# Patient Record
Sex: Female | Born: 2000
Health system: Southern US, Community
[De-identification: ages and names within clinical notes are randomized; demographics above are authoritative.]

---
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
## 2020-12-16 IMAGING — DX DG CHEST 1V PORT
1 series · 1 of 1 positions shown · non-contrast
Comparison: None.

CLINICAL DATA: Altered mental status after vaping marijuana.

EXAM:
PORTABLE CHEST 1 VIEW

[chest ap]
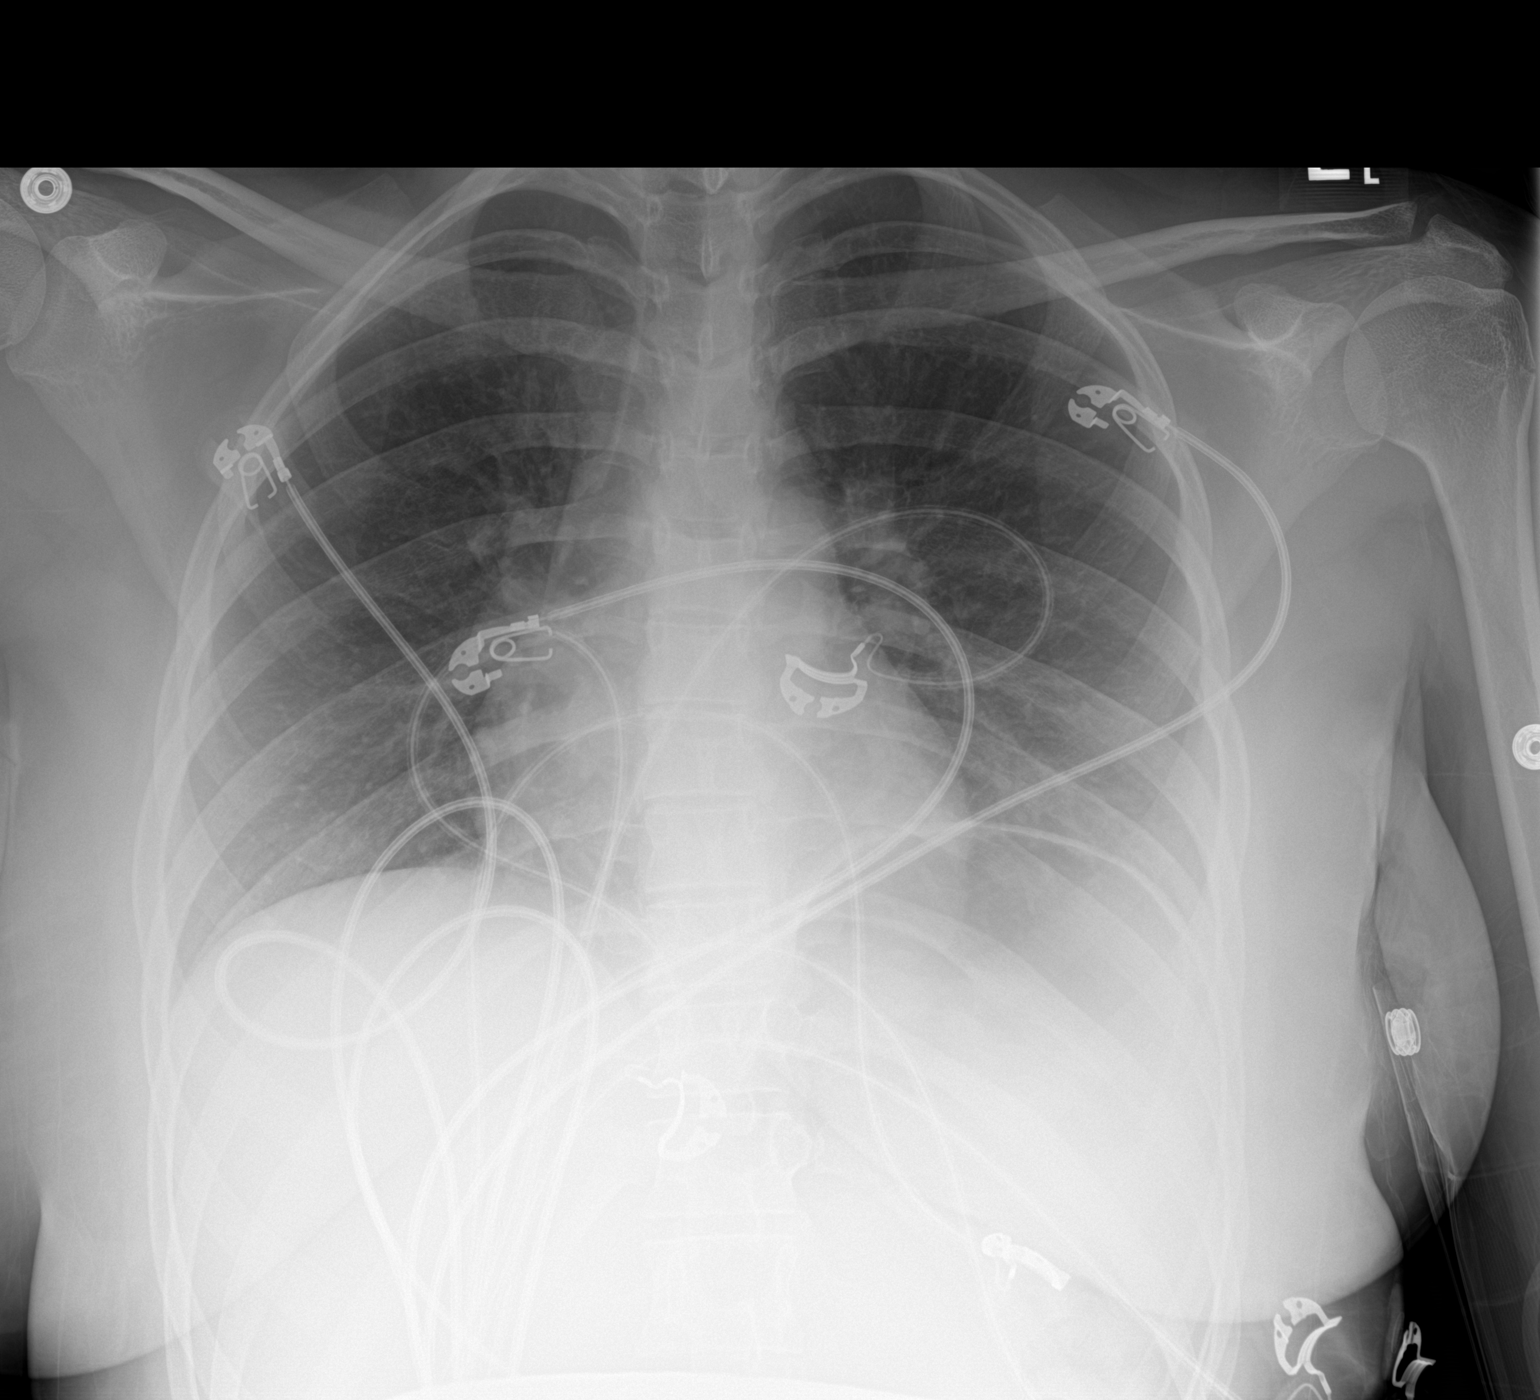

[1 of 1 positions shown; findings below may reference images not displayed]

FINDINGS: Small to moderate left pleural effusion and basilar airspace disease
are seen. The right lung is clear. No pneumothorax. Heart size is
normal.
IMPRESSION: Small to moderate left effusion and basilar airspace disease could
be secondary to vaping injury or pneumonia.

## 2023-03-27 DIAGNOSIS — Z01419 Encounter for gynecological examination (general) (routine) without abnormal findings: Secondary | ICD-10-CM | POA: Diagnosis not present

## 2023-03-27 DIAGNOSIS — Z113 Encounter for screening for infections with a predominantly sexual mode of transmission: Secondary | ICD-10-CM | POA: Diagnosis not present

## 2023-03-27 DIAGNOSIS — Z1159 Encounter for screening for other viral diseases: Secondary | ICD-10-CM | POA: Diagnosis not present

## 2023-03-27 DIAGNOSIS — Z1331 Encounter for screening for depression: Secondary | ICD-10-CM | POA: Diagnosis not present

## 2023-03-27 DIAGNOSIS — Z118 Encounter for screening for other infectious and parasitic diseases: Secondary | ICD-10-CM | POA: Diagnosis not present

## 2023-03-27 DIAGNOSIS — Z114 Encounter for screening for human immunodeficiency virus [HIV]: Secondary | ICD-10-CM | POA: Diagnosis not present

## 2023-03-27 DIAGNOSIS — Z124 Encounter for screening for malignant neoplasm of cervix: Secondary | ICD-10-CM | POA: Diagnosis not present

## 2023-04-16 ENCOUNTER — Encounter (HOSPITAL_BASED_OUTPATIENT_CLINIC_OR_DEPARTMENT_OTHER): Payer: Self-pay | Admitting: Family Medicine

## 2023-04-16 ENCOUNTER — Ambulatory Visit (HOSPITAL_BASED_OUTPATIENT_CLINIC_OR_DEPARTMENT_OTHER): Admitting: Family Medicine

## 2023-04-16 VITALS — BP 104/76 | HR 65 | Ht 63.0 in | Wt 111.9 lb

## 2023-04-16 DIAGNOSIS — G43909 Migraine, unspecified, not intractable, without status migrainosus: Secondary | ICD-10-CM | POA: Diagnosis not present

## 2023-04-16 DIAGNOSIS — Z0000 Encounter for general adult medical examination without abnormal findings: Secondary | ICD-10-CM

## 2023-04-16 DIAGNOSIS — J3489 Other specified disorders of nose and nasal sinuses: Secondary | ICD-10-CM | POA: Insufficient documentation

## 2023-04-16 MED ORDER — SUMATRIPTAN SUCCINATE 50 MG PO TABS: 50.0000 mg | ORAL_TABLET | ORAL | 0 refills | Status: AC | PRN

## 2023-04-16 NOTE — Patient Instructions (Signed)
  Medication Instructions:  Your physician recommends that you continue on your current medications as directed. Please refer to the Current Medication list given to you today. --If you need a refill on any your medications before your next appointment, please call your pharmacy first. If no refills are authorized on file call the office.-- Lab Work: Your physician has recommended that you have lab work today: 1 week prior to physical If you have labs (blood work) drawn today and your tests are completely normal, you will receive your results via MyChart message OR a phone call from our staff.  Please ensure you check your voicemail in the event that you authorized detailed messages to be left on a delegated number. If you have any lab test that is abnormal or we need to change your treatment, we will call you to review the results.  Referrals/Procedures/Imaging: no  Follow-Up: Your next appointment:   Your physician recommends that you schedule a follow-up appointment in: 6 weeks with Dr. de [place]  You will receive a text message or e-mail with a link to a survey about your care and experience with [place] today! We would greatly appreciate your feedback!   Thanks for letting us be apart of your health journey!!  Primary Care and Sports Medicine   Dr. Ceasar Mons [place]   We encourage you to activate your patient portal called "MyChart".  Sign up information is provided on this After Visit Summary.  MyChart is used to connect with patients for Virtual Visits (Telemedicine).  Patients are able to view lab/test results, encounter notes, upcoming appointments, etc.  Non-urgent messages can be sent to your provider as well. To learn more about what you can do with MyChart, please visit --  [email or web address].

## 2023-04-16 NOTE — Progress Notes (Signed)
 New Patient Office Visit  Subjective   Patient ID: Carol Garcia, female    DOB: [birth year]  Age: 23 y.o. MRN: [id number]  CC:  Chief Complaint  Patient presents with   New Patient (Initial Visit)    New Patient has not had pcp in awhile sometimes when sleeping has yellow drainage coming out of nose and feels like she is choking and she gets migraines this has been going on for a few years     HPI Carol Garcia presents to establish care Last PCP - none recently  Headaches: reports history of migraines. Was using Excedrin at one point, stopped taking because it wasn't working. Reports about 4 times per month, lasting a couple days. More so over right side of head, usually towards the front. Will have associated light and noise sensitivity. No visual changes.  Will occasionally have nasal drainage - yellow at times, notes only when laying down, does not happen all the time, no associated sneezing, watery or itchy eyes.  On further discussion, she indicates that her mom has told her that she did have issues with this in the past and has met with ENT specialist.  Indicates that procedural treatment was discussed, but patient ultimately declined  Patient is originally from [place]. Has lived here since 2017. 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 She enjoys relaxing.  Outpatient Encounter Medications as of 04/16/2023  Medication Sig   SUMAtriptan (IMITREX) 50 MG tablet Take 1 tablet (50 mg total) by mouth every 2 (two) hours as needed for migraine. May repeat in 2 hours if headache persists or recurs.   No facility-administered encounter medications on file as of 04/16/2023.    History reviewed. No pertinent past medical history.  History reviewed. No pertinent surgical history.  Family History  Problem Relation Age of Onset   Cancer Other     Social History   Socioeconomic History   Marital status: Single    Spouse name: Not on file   Number of children: Not on file    Years of education: Not on file   Highest education level: Not on file  Occupational History   Not on file  Tobacco Use   Smoking status: Never    Passive exposure: Never   Smokeless tobacco: Never  Vaping Use   Vaping status: Never Used  Substance and Sexual Activity   Alcohol use: No   Drug use: No   Sexual activity: Not on file  Other Topics Concern   Not on file  Social History Narrative   Not on file   Social Drivers of Health   Financial Resource Strain: Not on file  Food Insecurity: Not on file  Transportation Needs: Not on file  Physical Activity: Not on file  Stress: Not on file  Social Connections: Not on file  Intimate Partner Violence: Not on file    Objective   BP 104/76 (BP Location: Left Arm, Patient Position: Sitting, Cuff Size: Normal)   Pulse 65   Ht 5\' 3"  (1.6 m)   Wt 111 lb 14.4 oz (50.8 kg)   SpO2 100%   BMI 19.82 kg/m   Physical Exam  23 year old female in no acute distress Cardiovascular exam with regular rate and rhythm Lungs clear to auscultation bilaterally  Assessment & Plan:   Migraine without status migrainosus, not intractable, unspecified migraine type Assessment & Plan: Current headaches do sound like underlying migraines.  We discussed management considerations.  Can proceed with trial of triptan for abortive therapy.  Discussed recommendations related to when to take medication and expectations regarding management.  We can follow-up on progress at next office visit.  We also discussed maintaining headache diary.  Provided handout with considerations related to migraines and what to be mindful of from a lifestyle and dietary standpoint   Chronic nasal discharge Assessment & Plan: Discussed options.  She would like to meet with ENT specialist, referral placed today  Orders: -     Ambulatory referral to ENT  Wellness examination -     CBC with Differential/Platelet; Future -     Comprehensive metabolic panel with GFR;  Future -     Lipid panel; Future -     TSH Rfx on Abnormal to Free T4; Future  Other orders -     SUMAtriptan Succinate; Take 1 tablet (50 mg total) by mouth every 2 (two) hours as needed for migraine. May repeat in 2 hours if headache persists or recurs.  Dispense: 30 tablet; Refill: 0  Return in about 6 weeks (around 05/28/2023) for CPE with fasting labs 1 week prior.    ___________________________________________ Koreen Lizaola de Peru, MD, ABFM, CAQSM Primary Care and Sports Medicine [facility]

## 2023-04-16 NOTE — Assessment & Plan Note (Signed)
 Discussed options.  She would like to meet with ENT specialist, referral placed today

## 2023-04-16 NOTE — Assessment & Plan Note (Signed)
 Current headaches do sound like underlying migraines.  We discussed management considerations.  Can proceed with trial of triptan for abortive therapy.  Discussed recommendations related to when to take medication and expectations regarding management.  We can follow-up on progress at next office visit.  We also discussed maintaining headache diary.  Provided handout with considerations related to migraines and what to be mindful of from a lifestyle and dietary standpoint

## 2023-04-24 ENCOUNTER — Encounter (INDEPENDENT_AMBULATORY_CARE_PROVIDER_SITE_OTHER): Payer: Self-pay

## 2023-05-07 NOTE — Progress Notes (Unsigned)
 Cardiology Office Note:    Date:  05/08/2023   ID:  Carol Garcia 2000/02/06, MRN [id number]  PCP:  de Peru, Raymond J, MD  Cardiologist:  New Patient Click to update primary MD,subspecialty MD or APP then REFRESH:1}    Referring MD: de [place], Alonza Jansky, MD   Chief Complaint: palpitations   History of Present Illness:    Carol Garcia is a 23 y.o. female with a history of migraines but no prior cardiac history who presents today as a new patient for further evaluation of palpitations.   Patient states she was laying down watch a movie a couple of night ago when she all of a sudden felt like her heart was beating "hard and slow" in her chest. This lasted for a couple of seconds and then she had another episode of this shortly after. She sat up and palpitations resolved. She states she has had a "little fluttering" before but states this felt different. No prolonged episodes of palpitations. She denies any caffeine use. She describes some atypical chest pain about 2 months ago that lasted for a couple of days. She states it felt like a "bruise" and describes some pleuritic pain but states it was not reproducible with palpation. This pain went away after a couple of days. She denies any other chest pain. No exertional chest pain. No shortness of breath, orthopnea, PND, edema, lightheadedness, dizziness, or syncope.   She does have a family history of CAD with her maternal grandfather having had a heart attack in his 88s. However, no other known family history of heart disease. She denies any tobacco use. She reports very rare alcohol and marijuana use.   EKGs/Labs/Other Studies Reviewed:    The following studies were reviewed:  N/A  EKG:  EKG ordered today.  EKG Interpretation Date/Time:  Tuesday May 08 2023 08:33:24 EDT Ventricular Rate:  66 PR Interval:  134 QRS Duration:  70 QT Interval:  404 QTC Calculation: 423 R Axis:   56  Text Interpretation: Normal sinus  rhythm with sinus arrhythmia Normal ECG When compared with ECG of 27-Feb-2018 12:25, No significant change was found Confirmed by Sharren Decree [phone]) on 05/08/2023 8:40:11 AM     Recent Labs: No results found for requested labs within last 365 days.  Recent Lipid Panel No results found for: "CHOL", "TRIG", "HDL", "CHOLHDL", "VLDL", "LDLCALC", "LDLDIRECT"  Physical Exam:    Vital Signs: BP 96/70   Pulse 66   Ht 5\' 3"  (1.6 m)   Wt 108 lb (49 kg)   SpO2 98%   BMI 19.13 kg/m     Wt Readings from Last 3 Encounters:  05/08/23 108 lb (49 kg)  04/16/23 111 lb 14.4 oz (50.8 kg)  03/20/18 140 lb 12.8 oz (63.9 kg) (72%, Z= 0.59)*   * Growth percentiles are based on CDC (Girls, 2-20 Years) data.     General: 23 y.o. thin female in no acute distress. HEENT: Normocephalic and atraumatic. Sclera clear.  Neck: Supple. No carotid bruits. No JVD. Heart: RRR. Distinct S1 and S2. No murmurs, gallops, or rubs.  Lungs: No increased work of breathing. Clear to ausculation bilaterally. No wheezes, rhonchi, or rales.  Extremities: No lower extremity edema.   Skin: Warm and dry. Neuro: No focal deficits. Psych: Normal affect. Responds appropriately.   Assessment:    1. Palpitations   2. History of atypical chest pain   3. Screening for lipid disorders     Plan:    Palpitations  Patient reports an brief episode of palpitations that she describes as her heart "beating hard and slow" in her chest the other night. She also reports a "little fluttering" in the past but states this episode felt different. No prolonged/ sustained palpitations. No lightheadedness, dizziness, or syncope. - EKG today shows normal sinus rhythm with mild sinus arrhythmia.  - Symptoms sound like PACs/ PVCs. Explained that these are benign.  - If symptoms become more frequent, can order a Zio monitor. However, will hold off on this for now given palpitations seem to be rare. Also discussed [facility] device.  -  Will check CBC, CMET, Mag, and TSH.   History of Atypical Chest Pain Patient describes some atypical chest pain 2 months ago that lasted for a couple of days. No recurrence since then. No exertional chest pain. - EKG today shows no acute ischemic changes. - Sounds non-cardiac. Possible musculoskeletal. No additional ischemic work-up necessary at this time.   Screening Lipid Panel - Will go ahead and check fasting lipid panel today since we are getting other labs as well.   Disposition: Follow up as needed.   Signed, Casimer Clear, PA-C  05/08/2023 9:13 AM    [facility]

## 2023-05-08 ENCOUNTER — Encounter: Payer: Self-pay | Admitting: Student

## 2023-05-08 ENCOUNTER — Ambulatory Visit: Attending: Student | Admitting: Student

## 2023-05-08 VITALS — BP 96/70 | HR 66 | Ht 63.0 in | Wt 108.0 lb

## 2023-05-08 DIAGNOSIS — R002 Palpitations: Secondary | ICD-10-CM

## 2023-05-08 DIAGNOSIS — R0789 Other chest pain: Secondary | ICD-10-CM

## 2023-05-08 DIAGNOSIS — Z13220 Encounter for screening for lipoid disorders: Secondary | ICD-10-CM

## 2023-05-08 LAB — CBC

## 2023-05-08 LAB — MAGNESIUM: Magnesium: 2 mg/dL (ref 1.6–2.3)

## 2023-05-08 NOTE — Patient Instructions (Signed)
 Medication Instructions:  NO CHANGES  *If you need a refill on your cardiac medications before your next appointment, please call your pharmacy*  Lab Work: Lipid Panel  TSH CBC CMET Magnesium   If you have labs (blood work) drawn today and your tests are completely normal, you will receive your results only by: MyChart Message (if you have MyChart) OR A paper copy in the mail If you have any lab test that is abnormal or we need to change your treatment, we will call you to review the results.   Follow-Up: At [facility], you and your health needs are our priority.  As part of our continuing mission to provide you with exceptional heart care, our providers are all part of one team.  This team includes your primary Cardiologist (physician) and Advanced Practice Providers or APPs (Physician Assistants and Nurse Practitioners) who all work together to provide you with the care you need, when you need it.  Your next appointment:   AS NEEDED      1st Floor: - Lobby - Registration  - Pharmacy  - Lab - Cafe  2nd Floor: - PV Lab - Diagnostic Testing (echo, CT, nuclear med)  3rd Floor: - Vacant  4th Floor: - TCTS (cardiothoracic surgery) - AFib Clinic - Structural Heart Clinic - Vascular Surgery  - Vascular Ultrasound  5th Floor: - HeartCare Cardiology (general and EP) - Clinical Pharmacy for coumadin, hypertension, lipid, weight-loss medications, and med management appointments    Valet parking services will be available as well.

## 2023-05-09 LAB — COMPREHENSIVE METABOLIC PANEL WITH GFR
ALT: 11 IU/L (ref 0–32)
AST: 17 IU/L (ref 0–40)
Albumin: 4.5 g/dL (ref 4.0–5.0)
Alkaline Phosphatase: 53 IU/L (ref 44–121)
BUN/Creatinine Ratio: 20 (ref 9–23)
BUN: 16 mg/dL (ref 6–20)
Bilirubin Total: 0.4 mg/dL (ref 0.0–1.2)
CO2: 21 mmol/L (ref 20–29)
Calcium: 9.4 mg/dL (ref 8.7–10.2)
Chloride: 107 mmol/L — ABNORMAL HIGH (ref 96–106)
Creatinine, Ser: 0.8 mg/dL (ref 0.57–1.00)
Globulin, Total: 2.8 g/dL (ref 1.5–4.5)
Potassium: 4.5 mmol/L (ref 3.5–5.2)
Potassium: 84 mmol/L (ref 70–99)
Sodium: 140 mmol/L (ref 134–144)
Total Protein: 7.3 g/dL (ref 6.0–8.5)
eGFR: 105 mL/min/{1.73_m2} (ref >59)

## 2023-05-09 LAB — CBC
Hematocrit: 41.1 % (ref 34.0–46.6)
Hemoglobin: 13.1 g/dL (ref 11.1–15.9)
MCH: 26.5 pg — ABNORMAL LOW (ref 26.6–33.0)
MCHC: 31.9 g/dL (ref 31.5–35.7)
MCV: 83 fL (ref 79–97)
Platelets: 275 10*3/uL (ref 150–450)
RBC: 4.94 x10E6/uL (ref 3.77–5.28)
RDW: 13 % (ref 11.7–15.4)
WBC: 6.8 10*3/uL (ref 3.4–10.8)

## 2023-05-09 LAB — LIPID PANEL
Chol/HDL Ratio: 2.3 ratio (ref 0.0–4.4)
Cholesterol, Total: 147 mg/dL (ref 100–199)
HDL: 63 mg/dL (ref >39)
LDL Chol Calc (NIH): 75 mg/dL (ref 0–99)
Triglycerides: 35 mg/dL (ref 0–149)
VLDL Cholesterol Cal: 9 mg/dL (ref 5–40)

## 2023-05-09 LAB — TSH: TSH: 0.707 u[IU]/mL (ref 0.450–4.500)

## 2023-06-01 ENCOUNTER — Other Ambulatory Visit (HOSPITAL_COMMUNITY): Payer: Self-pay | Admitting: Family Medicine

## 2023-06-01 ENCOUNTER — Other Ambulatory Visit (HOSPITAL_BASED_OUTPATIENT_CLINIC_OR_DEPARTMENT_OTHER): Payer: Self-pay | Admitting: *Deleted

## 2023-06-01 DIAGNOSIS — Z0000 Encounter for general adult medical examination without abnormal findings: Secondary | ICD-10-CM

## 2023-06-02 LAB — COMPREHENSIVE METABOLIC PANEL WITH GFR
ALT: 12 IU/L (ref 0–32)
AST: 14 IU/L (ref 0–40)
Albumin: 4.4 g/dL (ref 4.0–5.0)
Alkaline Phosphatase: 53 IU/L (ref 44–121)
BUN/Creatinine Ratio: 17 (ref 9–23)
BUN: 14 mg/dL (ref 6–20)
Bilirubin Total: 0.5 mg/dL (ref 0.0–1.2)
CO2: 21 mmol/L (ref 20–29)
Calcium: 9.4 mg/dL (ref 8.7–10.2)
Chloride: 107 mmol/L — ABNORMAL HIGH (ref 96–106)
Creatinine, Ser: 0.81 mg/dL (ref 0.57–1.00)
Globulin, Total: 2.3 g/dL (ref 1.5–4.5)
Glucose: 87 mg/dL (ref 70–99)
Potassium: 3.9 mmol/L (ref 3.5–5.2)
Sodium: 140 mmol/L (ref 134–144)
Total Protein: 6.7 g/dL (ref 6.0–8.5)
eGFR: 104 mL/min/{1.73_m2} (ref >59)

## 2023-06-02 LAB — LIPID PANEL
Chol/HDL Ratio: 2.2 ratio (ref 0.0–4.4)
Cholesterol, Total: 139 mg/dL (ref 100–199)
HDL: 64 mg/dL (ref >39)
LDL Chol Calc (NIH): 65 mg/dL (ref 0–99)
Triglycerides: 40 mg/dL (ref 0–149)
VLDL Cholesterol Cal: 10 mg/dL (ref 5–40)

## 2023-06-02 LAB — CBC WITH DIFFERENTIAL/PLATELET
Basophils Absolute: 0 10*3/uL (ref 0.0–0.2)
Basos: 1 %
EOS (ABSOLUTE): 0.2 10*3/uL (ref 0.0–0.4)
Eos: 2 %
Hematocrit: 39.5 % (ref 34.0–46.6)
Hemoglobin: 12.4 g/dL (ref 11.1–15.9)
Immature Grans (Abs): 0 10*3/uL (ref 0.0–0.1)
Immature Granulocytes: 0 %
Lymphocytes Absolute: 2.1 10*3/uL (ref 0.7–3.1)
Lymphs: 28 %
MCH: 26.7 pg (ref 26.6–33.0)
MCHC: 31.4 g/dL — ABNORMAL LOW (ref 31.5–35.7)
MCV: 85 fL (ref 79–97)
Monocytes Absolute: 0.8 10*3/uL (ref 0.1–0.9)
Monocytes: 10 %
Neutrophils Absolute: 4.4 10*3/uL (ref 1.4–7.0)
Neutrophils: 59 %
Platelets: 250 10*3/uL (ref 150–450)
RBC: 4.64 x10E6/uL (ref 3.77–5.28)
RDW: 13.5 % (ref 11.7–15.4)
WBC: 7.5 10*3/uL (ref 3.4–10.8)

## 2023-06-02 LAB — TSH RFX ON ABNORMAL TO FREE T4: TSH: 1.45 u[IU]/mL (ref 0.450–4.500)

## 2023-06-07 ENCOUNTER — Other Ambulatory Visit (HOSPITAL_COMMUNITY)
Admission: RE | Admit: 2023-06-07 | Discharge: 2023-06-07 | Disposition: A | Discharge status: Home / Self Care | Source: Ambulatory Visit | Attending: Family Medicine | Admitting: Family Medicine

## 2023-06-07 ENCOUNTER — Ambulatory Visit (INDEPENDENT_AMBULATORY_CARE_PROVIDER_SITE_OTHER): Payer: PRIVATE HEALTH INSURANCE | Admitting: Family Medicine

## 2023-06-07 VITALS — BP 114/73 | HR 73 | Ht 63.0 in | Wt 112.2 lb

## 2023-06-07 DIAGNOSIS — Z113 Encounter for screening for infections with a predominantly sexual mode of transmission: Secondary | ICD-10-CM | POA: Diagnosis not present

## 2023-06-07 DIAGNOSIS — Z0000 Encounter for general adult medical examination without abnormal findings: Secondary | ICD-10-CM

## 2023-06-07 NOTE — Patient Instructions (Signed)
  Medication Instructions:  Your physician recommends that you continue on your current medications as directed. Please refer to the Current Medication list given to you today. --If you need a refill on any your medications before your next appointment, please call your pharmacy first. If no refills are authorized on file call the office.-- Lab Work: Your physician has recommended that you have lab work today: yes If you have labs (blood work) drawn today and your tests are completely normal, you will receive your results via MyChart message OR a phone call from our staff.  Please ensure you check your voicemail in the event that you authorized detailed messages to be left on a delegated number. If you have any lab test that is abnormal or we need to change your treatment, we will call you to review the results.  Referrals/Procedures/Imaging: no  Follow-Up: Your next appointment:   Your physician recommends that you schedule a follow-up appointment in: 1 year  with Dr. de Peru  You will receive a text message or e-mail with a link to a survey about your care and experience with [place] today! We would greatly appreciate your feedback!   Thanks for letting us be apart of your health journey!!  Primary Care and Sports Medicine   Dr. Ceasar Mons Peru   We encourage you to activate your patient portal called "MyChart".  Sign up information is provided on this After Visit Summary.  MyChart is used to connect with patients for Virtual Visits (Telemedicine).  Patients are able to view lab/test results, encounter notes, upcoming appointments, etc.  Non-urgent messages can be sent to your provider as well. To learn more about what you can do with MyChart, please visit --  [email or web address].

## 2023-06-07 NOTE — Progress Notes (Signed)
 Subjective:    CC: Annual Physical Exam  HPI: Carol Garcia is a 23 y.o. presenting for annual physical  I reviewed the past medical history, family history, social history, surgical history, and allergies today and no changes were needed.  Please see the problem list section below in epic for further details.  Past Medical History: No past medical history on file. Past Surgical History: No past surgical history on file. Social History: Social History   Socioeconomic History   Marital status: Single    Spouse name: Not on file   Number of children: Not on file   Years of education: Not on file   Highest education level: Not on file  Occupational History   Not on file  Tobacco Use   Smoking status: Never    Passive exposure: Never   Smokeless tobacco: Never  Vaping Use   Vaping status: Never Used  Substance and Sexual Activity   Alcohol use: No   Drug use: No   Sexual activity: Not on file  Other Topics Concern   Not on file  Social History Narrative   Not on file   Social Drivers of Health   Financial Resource Strain: Not on file  Food Insecurity: Not on file  Transportation Needs: Not on file  Physical Activity: Not on file  Stress: Not on file  Social Connections: Not on file   Family History: Family History  Problem Relation Age of Onset   Cancer Other    Allergies: No Known Allergies Medications: See med rec.  Review of Systems: No headache, visual changes, nausea, vomiting, diarrhea, constipation, dizziness, abdominal pain, skin rash, fevers, chills, night sweats, swollen lymph nodes, weight loss, chest pain, body aches, joint swelling, muscle aches, shortness of breath, mood changes, visual or auditory hallucinations.  Objective:    BP 114/73   Pulse 73   Ht 5\' 3"  (1.6 m)   Wt 112 lb 3.2 oz (50.9 kg)   SpO2 100%   BMI 19.88 kg/m   General: Well Developed, well nourished, and in no acute distress.  Neuro: Alert and oriented x3,  extra-ocular muscles intact, sensation grossly intact. Cranial nerves II through XII are intact, motor, sensory, and coordinative functions are all intact. HEENT: Normocephalic, atraumatic, pupils equal round reactive to light, neck supple, no masses, no lymphadenopathy, thyroid nonpalpable. Oropharynx, nasopharynx, external ear canals are unremarkable. Skin: Warm and dry, no rashes noted.  Cardiac: Regular rate and rhythm, no murmurs rubs or gallops.  Respiratory: Clear to auscultation bilaterally. Not using accessory muscles, speaking in full sentences.  Abdominal: Soft, nontender, nondistended, positive bowel sounds, no masses, no organomegaly.  Musculoskeletal: Shoulder, elbow, wrist, hip, knee, ankle stable, and with full range of motion.  Impression and Recommendations:    Wellness examination Assessment & Plan: Routine HCM labs reviewed. HCM reviewed/discussed. Anticipatory guidance regarding healthy weight, lifestyle and choices given. Recommend healthy diet.  Recommend approximately 150 minutes/week of moderate intensity exercise Recommend regular dental and vision exams Always use seatbelt/lap and shoulder restraints Recommend using smoke alarms and checking batteries at least twice a year Recommend using sunscreen when outside Discussed immunization recommendations   Routine screening for STI (sexually transmitted infection) Assessment & Plan: Patient would like to have STI screening completed today.  She denies any current symptoms; she is also not aware of any specific exposures. Can proceed with screening today  Orders: -     HIV Antibody (routine testing w rflx) -     RPR -  Urine cytology ancillary only  Return in about 1 year (around 06/06/2024) for CPE.   ___________________________________________ Carol Desaulniers de Peru, MD, ABFM, CAQSM Primary Care and Sports Medicine [facility]

## 2023-06-07 NOTE — Assessment & Plan Note (Signed)
 Patient would like to have STI screening completed today.  She denies any current symptoms; she is also not aware of any specific exposures. Can proceed with screening today

## 2023-06-07 NOTE — Assessment & Plan Note (Signed)

## 2023-06-08 ENCOUNTER — Telehealth (INDEPENDENT_AMBULATORY_CARE_PROVIDER_SITE_OTHER): Payer: Self-pay | Admitting: Otolaryngology

## 2023-06-08 LAB — URINE CYTOLOGY ANCILLARY ONLY
Chlamydia: NEGATIVE
Comment: NEGATIVE
Comment: NORMAL
Neisseria Gonorrhea: NEGATIVE

## 2023-06-08 LAB — RPR: RPR Ser Ql: NONREACTIVE

## 2023-06-08 LAB — HIV ANTIBODY (ROUTINE TESTING W REFLEX): HIV Screen 4th Generation wRfx: NONREACTIVE

## 2023-06-08 NOTE — Telephone Encounter (Signed)
 Confirmed appt & location [phone]

## 2023-06-12 ENCOUNTER — Ambulatory Visit (INDEPENDENT_AMBULATORY_CARE_PROVIDER_SITE_OTHER): Admitting: Otolaryngology

## 2023-06-12 ENCOUNTER — Encounter (INDEPENDENT_AMBULATORY_CARE_PROVIDER_SITE_OTHER): Payer: Self-pay | Admitting: Otolaryngology

## 2023-06-12 VITALS — BP 116/77 | HR 66 | Ht 63.0 in | Wt 112.0 lb

## 2023-06-12 DIAGNOSIS — J342 Deviated nasal septum: Secondary | ICD-10-CM | POA: Diagnosis not present

## 2023-06-12 DIAGNOSIS — J3489 Other specified disorders of nose and nasal sinuses: Secondary | ICD-10-CM | POA: Diagnosis not present

## 2023-06-12 DIAGNOSIS — N760 Acute vaginitis: Secondary | ICD-10-CM | POA: Diagnosis not present

## 2023-06-12 DIAGNOSIS — J329 Chronic sinusitis, unspecified: Secondary | ICD-10-CM

## 2023-06-12 DIAGNOSIS — J343 Hypertrophy of nasal turbinates: Secondary | ICD-10-CM | POA: Diagnosis not present

## 2023-06-12 DIAGNOSIS — R0982 Postnasal drip: Secondary | ICD-10-CM

## 2023-06-12 DIAGNOSIS — R0981 Nasal congestion: Secondary | ICD-10-CM

## 2023-06-12 DIAGNOSIS — J3089 Other allergic rhinitis: Secondary | ICD-10-CM

## 2023-06-12 MED ORDER — LEVOCETIRIZINE DIHYDROCHLORIDE 5 MG PO TABS: 5.0000 mg | ORAL_TABLET | Freq: Every evening | ORAL | 3 refills | Status: AC

## 2023-06-12 MED ORDER — FLUTICASONE PROPIONATE 50 MCG/ACT NA SUSP: 2.0000 | Freq: Every day | NASAL | 6 refills | Status: AC

## 2023-06-12 NOTE — Patient Instructions (Signed)
 Lloyd Huger Med Nasal Saline Rinse   - start nasal saline rinses with NeilMed Bottle available over the counter or online to help with nasal congestion

## 2023-06-12 NOTE — Progress Notes (Signed)
 ENT CONSULT:  Reason for Consult: chronic nasal congestion and post-nasal drainage    HPI: Discussed the use of AI scribe software for clinical note transcription with the patient, who gave verbal consent to proceed.  History of Present Illness Carol Garcia is a 23 year old female who presents with chronic nasal congestion and post-nasal drainage, worse at night.  She has a long-standing history of nasal congestion and drainage, described as watery and sometimes yellow. Her mother has noted the persistence of these symptoms, although she has only recently become aware of her chronic nature. No recent sinus infections, but a history of frequent sinus infections during childhood.  She experiences a sensation in her tooth when tilting her head back, which her dentist attributed to low sinuses based on dental scans. She recalls being taken to an ear, nose, and throat specialist in [place] during her childhood, where surgery was suggested, but she did not pursue it at the time.  She has episodes of feeling like she is choking on drainage while sleeping, describing it as 'like somebody's pouring water', which causes her to wake up and the drainage to pour out. Her sense of smell is ok. No headaches.   She has not been tested for allergies recently and is not currently on any medications for nasal congestion or drainage.    History reviewed. No pertinent past medical history.  History reviewed. No pertinent surgical history.  Family History  Problem Relation Age of Onset   Cancer Other     Social History:  reports that she has never smoked. She has never been exposed to tobacco smoke. She has never used smokeless tobacco. She reports that she does not drink alcohol and does not use drugs.  Allergies: No Known Allergies  Medications: I have reviewed the patient's current medications.  The PMH, PSH, Medications, Allergies, and SH were reviewed and updated.  ROS: Constitutional:  Negative for fever, weight loss and weight gain. Cardiovascular: Negative for chest pain and dyspnea on exertion. Respiratory: Is not experiencing shortness of breath at rest. Gastrointestinal: Negative for nausea and vomiting. Neurological: Negative for headaches. Psychiatric: The patient is not nervous/anxious  Blood pressure 116/77, pulse 66, height 5\' 3"  (1.6 m), weight 112 lb (50.8 kg), SpO2 99%. Body mass index is 19.84 kg/m.  PHYSICAL EXAM:  Exam: General: Well-developed, well-nourished Communication and Voice: Clear pitch and clarity Respiratory Respiratory effort: Equal inspiration and expiration without stridor Cardiovascular Peripheral Vascular: Warm extremities with equal color/perfusion Eyes: No nystagmus with equal extraocular motion bilaterally Neuro/Psych/Balance: Patient oriented to person, place, and time; Appropriate mood and affect; Gait is intact with no imbalance; Cranial nerves I-XII are intact Head and Face Inspection: Normocephalic and atraumatic without mass or lesion Palpation: Facial skeleton intact without bony stepoffs Salivary Glands: No mass or tenderness Facial Strength: Facial motility symmetric and full bilaterally ENT Pinna: External ear intact and fully developed External canal: Canal is patent with intact skin Tympanic Membrane: Clear and mobile External Nose: No scar or anatomic deformity Internal Nose: Septum is S-shaped and deviated with L > R narrowing. No polyp, or purulence. Mucosal edema and erythema present.  Bilateral inferior turbinate hypertrophy.  Lips, Teeth, and gums: Mucosa and teeth intact and viable TMJ: No pain to palpation with full mobility Oral cavity/oropharynx: No erythema or exudate, no lesions present Nasopharynx: No mass or lesion with intact mucosa Hypopharynx: Intact mucosa without pooling of secretions Neck Neck and Trachea: Midline trachea without mass or lesion Thyroid: No mass or nodularity  Lymphatics: No  lymphadenopathy  Procedure:   PROCEDURE NOTE: nasal endoscopy  Preoperative diagnosis: chronic nasal congestion and concern for sinusitis   Postoperative diagnosis: same  Procedure: Diagnostic nasal endoscopy (78295)  Surgeon: Artice Last, M.D.  Anesthesia: Topical lidocaine and Afrin  H&P REVIEW: The patient's history and physical were reviewed today prior to procedure. All medications were reviewed and updated as well. Complications: None Condition is stable throughout exam Indications and consent: The patient presents with symptoms of chronic sinusitis not responding to previous therapies. All the risks, benefits, and potential complications were reviewed with the patient preoperatively and informed consent was obtained. The time out was completed with confirmation of the correct procedure.   Procedure: The patient was seated upright in the clinic. Topical lidocaine and Afrin were applied to the nasal cavity. After adequate anesthesia had occurred, the rigid nasal endoscope was passed into the nasal cavity. The nasal mucosa, turbinates, septum, and sinus drainage pathways were visualized bilaterally. This revealed no purulence or significant secretions that might be cultured. There were no polyps or sites of significant inflammation. The mucosa was intact and there was no crusting present. The scope was then slowly withdrawn and the patient tolerated the procedure well. There were no complications or blood loss.      Studies Reviewed: 02/25/2018 FINDINGS: Small to moderate left pleural effusion and basilar airspace disease are seen. The right lung is clear. No pneumothorax. Heart size is normal.   IMPRESSION: Small to moderate left effusion and basilar airspace disease could be secondary to vaping injury or pneumonia.  Assessment/Plan: No diagnosis found.  Assessment and Plan Assessment & Plan Chronic nasal congestion and nasal obstruction and suspected environmental  allergies  Deviated nasal septum with ITH on nasal endoscopy today, small polypoid change in the left middle meatus, but no other polyps or pus.  - Initiated nightly antihistamine, Xyzal 5 mg daily  - Administered nasal corticosteroid spray (Flonase) once or twice daily. - Advised nasal irrigation with saline using a neti pot or Neilmed bottle.  Chronic vs Recurrent sinusitis concerns (suspected)  Differential includes sinus-related versus nasal obstruction and allergic inflammation. Allergy testing and sinus scan necessary for evaluation. - Order sinus CT sinus scan to evaluate for chronic sinusitis. - Recommend allergy testing to assess contributing factors.  RTC after CT sinuses and allergy testing (4 months)   Thank you for allowing me to participate in the care of this patient. Please do not hesitate to contact me with any questions or concerns.   Artice Last, MD Otolaryngology [facility] Phone: [phone] Fax: [phone]    06/12/2023, 8:40 AM

## 2023-06-17 ENCOUNTER — Telehealth: Payer: Self-pay | Admitting: Emergency Medicine

## 2023-06-17 ENCOUNTER — Encounter: Payer: Self-pay | Admitting: Emergency Medicine

## 2023-06-17 ENCOUNTER — Ambulatory Visit
Admission: EM | Admit: 2023-06-17 | Discharge: 2023-06-17 | Disposition: A | Discharge status: Home / Self Care | Attending: Family Medicine | Admitting: Family Medicine

## 2023-06-17 DIAGNOSIS — N898 Other specified noninflammatory disorders of vagina: Secondary | ICD-10-CM

## 2023-06-17 DIAGNOSIS — Z113 Encounter for screening for infections with a predominantly sexual mode of transmission: Secondary | ICD-10-CM | POA: Diagnosis not present

## 2023-06-17 DIAGNOSIS — R82998 Other abnormal findings in urine: Secondary | ICD-10-CM | POA: Diagnosis not present

## 2023-06-17 LAB — POCT URINALYSIS DIP (MANUAL ENTRY)
Bilirubin, UA: NEGATIVE
Blood, UA: NEGATIVE
Glucose, UA: NEGATIVE mg/dL
Ketones, POC UA: NEGATIVE mg/dL
Nitrite, UA: NEGATIVE
Protein Ur, POC: NEGATIVE mg/dL
Spec Grav, UA: 1.02 (ref 1.010–1.025)
Urobilinogen, UA: 0.2 U/dL
pH, UA: 6 (ref 5.0–8.0)

## 2023-06-17 LAB — POCT URINE PREGNANCY: Preg Test, Ur: NEGATIVE

## 2023-06-17 MED ORDER — CLOTRIMAZOLE 1 % VA CREA: 1.0000 | TOPICAL_CREAM | Freq: Every day | VAGINAL | 0 refills | Status: AC

## 2023-06-17 MED ORDER — FLUCONAZOLE 150 MG PO TABS
150.0000 mg | ORAL_TABLET | Freq: Every day | ORAL | 0 refills | Status: AC
Start: 2023-06-17 — End: ?

## 2023-06-17 MED ORDER — CLOTRIMAZOLE 1 % VA CREA: 1.0000 | TOPICAL_CREAM | Freq: Every day | VAGINAL | 0 refills | Status: DC

## 2023-06-17 MED ORDER — FLUCONAZOLE 150 MG PO TABS: 150.0000 mg | ORAL_TABLET | Freq: Every day | ORAL | 0 refills | Status: DC

## 2023-06-17 NOTE — ED Triage Notes (Signed)
 Patient states that she's been having vaginal irritation on the outside x 2 weeks.  Patient has seen her GYN given Metronidazole 500mg  and Tinidazole 500mg  w/o relief.  Still having vaginal discharge.  Pt was diagnosed with BV.  All of her STI testing was negative.

## 2023-06-17 NOTE — Discharge Instructions (Signed)
 The clinic will contact you with results of the urine culture as well as STD testing/vaginal swab done today.  Continue the metronidazole that you have been previously prescribed.  Stop the denies all and start Diflucan as well as topical clotrimazole vaginal cream externally to the area as needed.  Please follow-up with your gynecologist if your symptoms do not improve.  Please go to the ER for any worsening symptoms.  Hope you feel better soon!

## 2023-06-17 NOTE — ED Provider Notes (Signed)
 [facility]    CSN: [id number] Arrival date & time: 06/17/23  9811      History   Chief Complaint Chief Complaint  Patient presents with   Vaginitis    HPI Carol Garcia is a 23 y.o. female presents for vaginal irritation.  Patient reports 2 weeks of a external vaginal irritation/redness/swelling as well as a malodorous vaginal discharge.  She denies any vaginal rashes, shaving the area, dysuria, fevers, STD exposure or concern.  She did see her gynecologist last week and had a urine GC chlamydia that was negative as well as a negative HIV and RPR.  States a point-of-care testing was done for BV that was positive and she was started on metronidazole as well as tonight is all for potential yeast as well.  Patient's been taking both for about 3 to 4 days and states the odor has resolved from the discharge but she continues to have a lot of irritation externally.  No OTC treatments have been used since onset.  No other concerns at this time.  HPI  History reviewed. No pertinent past medical history.  Patient Active Problem List   Diagnosis Date Noted   Wellness examination 06/07/2023   Routine screening for STI (sexually transmitted infection) 06/07/2023   Chronic nasal discharge 04/16/2023   Migraine 04/16/2023    History reviewed. No pertinent surgical history.  OB History   No obstetric history on file.      Home Medications    Prior to Admission medications   Medication Sig Start Date End Date Taking? Authorizing Provider  clotrimazole (GYNE-LOTRIMIN) 1 % vaginal cream Place 1 Applicatorful vaginally at bedtime for 5 days. Apply externally to the vaginal area nightly 06/17/23 06/22/23 Yes Tavionna Grout, Jodi R, NP  fluconazole (DIFLUCAN) 150 MG tablet Take 1 tablet (150 mg total) by mouth daily. 06/17/23  Yes Kaimani Clayson, Jodi R, NP  fluticasone  (FLONASE ) 50 MCG/ACT nasal spray Place 2 sprays into both nostrils daily. 06/12/23  Yes Soldatova, Liuba, MD  levocetirizine (XYZAL   ALLERGY 24HR) 5 MG tablet Take 1 tablet (5 mg total) by mouth every evening. 06/12/23  Yes Soldatova, Liuba, MD  SUMAtriptan  (IMITREX ) 50 MG tablet Take 1 tablet (50 mg total) by mouth every 2 (two) hours as needed for migraine. May repeat in 2 hours if headache persists or recurs. Patient not taking: Reported on 06/12/2023 04/16/23   de [place], Alonza Jansky, MD    Family History Family History  Problem Relation Age of Onset   Cancer Other     Social History Social History   Tobacco Use   Smoking status: Never    Passive exposure: Never   Smokeless tobacco: Never  Vaping Use   Vaping status: Never Used  Substance Use Topics   Alcohol use: No   Drug use: No     Allergies   Patient has no known allergies.   Review of Systems Review of Systems  Genitourinary:        Vaginal irritation      Physical Exam Triage Vital Signs ED Triage Vitals  Encounter Vitals Group     BP 06/17/23 0933 107/77     Systolic BP Percentile --      Diastolic BP Percentile --      Pulse Rate 06/17/23 0933 69     Resp 06/17/23 0933 18     Temp 06/17/23 0933 98.7 F (37.1 C)     Temp Source 06/17/23 0933 Oral     SpO2 06/17/23 0933 98 %  Weight 06/17/23 0935 112 lb (50.8 kg)     Height 06/17/23 0935 5\' 3"  (1.6 m)     Head Circumference --      Peak Flow --      Pain Score 06/17/23 0935 4     Pain Loc --      Pain Education --      Exclude from Growth Chart --    No data found.  Updated Vital Signs BP 107/77 (BP Location: Left Arm)   Pulse 69   Temp 98.7 F (37.1 C) (Oral)   Resp 18   Ht 5\' 3"  (1.6 m)   Wt 112 lb (50.8 kg)   LMP 06/06/2023 (Exact Date)   SpO2 98%   BMI 19.84 kg/m   Visual Acuity Right Eye Distance:   Left Eye Distance:   Bilateral Distance:    Right Eye Near:   Left Eye Near:    Bilateral Near:     Physical Exam Vitals and nursing note reviewed.  Constitutional:      General: She is not in acute distress.    Appearance: Normal appearance. She is not  ill-appearing.  HENT:     Head: Normocephalic and atraumatic.  Eyes:     Pupils: Pupils are equal, round, and reactive to light.  Cardiovascular:     Rate and Rhythm: Normal rate.  Pulmonary:     Effort: Pulmonary effort is normal.  Abdominal:     Tenderness: There is no right CVA tenderness or left CVA tenderness.  Genitourinary:    Comments: Declined vaginal exam  Skin:    General: Skin is warm and dry.  Neurological:     General: No focal deficit present.     Mental Status: She is alert and oriented to person, place, and time.  Psychiatric:        Mood and Affect: Mood normal.        Behavior: Behavior normal.      UC Treatments / Results  Labs (all labs ordered are listed, but only abnormal results are displayed) Labs Reviewed  POCT URINALYSIS DIP (MANUAL ENTRY) - Abnormal; Notable for the following components:      Result Value   Leukocytes, UA Small (1+) (*)    All other components within normal limits  URINE CULTURE  POCT URINE PREGNANCY  CERVICOVAGINAL ANCILLARY ONLY    EKG   Radiology No results found.  Procedures Procedures (including critical care time)  Medications Ordered in UC Medications - No data to display  Initial Impression / Assessment and Plan / UC Course  I have reviewed the triage vital signs and the nursing notes.  Pertinent labs & imaging results that were available during my care of the patient were reviewed by me and considered in my medical decision making (see chart for details).     Reviewed exam and symptoms with patient.  Urine with small leuks.  Patient denies any dysuria.  Will send urine culture and contact for any positive results.  Will send vaginal swab for GC chlamydia trichomonas and BV and yeast testing.  Again will contact for any positive results.  Advised her to continue the metronidazole as prescribed as this did help with her odor.  She can stop the tonight is all and will start Diflucan as well as a topical  clotrimazole.  Advised to follow-up with her PCP or GYN if symptoms do not improve.  ER precautions reviewed. Final Clinical Impressions(s) / UC Diagnoses   Final diagnoses:  Vaginal irritation  Screening examination for STD (sexually transmitted disease)  Leukocytes in urine     Discharge Instructions      The clinic will contact you with results of the urine culture as well as STD testing/vaginal swab done today.  Continue the metronidazole that you have been previously prescribed.  Stop the denies all and start Diflucan as well as topical clotrimazole vaginal cream externally to the area as needed.  Please follow-up with your gynecologist if your symptoms do not improve.  Please go to the ER for any worsening symptoms.  Hope you feel better soon!  ED Prescriptions     Medication Sig Dispense Auth. Provider   fluconazole (DIFLUCAN) 150 MG tablet Take 1 tablet (150 mg total) by mouth daily. 1 tablet Ainsleigh Kakos, Jodi R, NP   clotrimazole (GYNE-LOTRIMIN) 1 % vaginal cream Place 1 Applicatorful vaginally at bedtime for 5 days. Apply externally to the vaginal area nightly 45 g Vaden Becherer, Jodi R, NP      PDMP not reviewed this encounter.   Alleen Arbour, NP 06/17/23 1010

## 2023-06-17 NOTE — Telephone Encounter (Signed)
 Pt called requesting medication be sent to [facility].

## 2023-06-18 ENCOUNTER — Ambulatory Visit (HOSPITAL_COMMUNITY): Payer: Self-pay

## 2023-06-18 LAB — CERVICOVAGINAL ANCILLARY ONLY
Bacterial Vaginitis (gardnerella): NEGATIVE
Candida Glabrata: NEGATIVE
Candida Vaginitis: POSITIVE — AB
Chlamydia: NEGATIVE
Comment: NEGATIVE
Comment: NEGATIVE
Comment: NEGATIVE
Comment: NEGATIVE
Comment: NEGATIVE
Comment: NORMAL
Neisseria Gonorrhea: NEGATIVE
Trichomonas: NEGATIVE

## 2023-06-18 LAB — URINE CULTURE: Culture: NO GROWTH

## 2023-06-19 ENCOUNTER — Ambulatory Visit (HOSPITAL_BASED_OUTPATIENT_CLINIC_OR_DEPARTMENT_OTHER): Payer: Self-pay | Admitting: Family Medicine

## 2023-08-01 DIAGNOSIS — N898 Other specified noninflammatory disorders of vagina: Secondary | ICD-10-CM | POA: Diagnosis not present

## 2023-08-01 DIAGNOSIS — A084 Viral intestinal infection, unspecified: Secondary | ICD-10-CM | POA: Diagnosis not present

## 2023-08-01 DIAGNOSIS — R112 Nausea with vomiting, unspecified: Secondary | ICD-10-CM | POA: Diagnosis not present

## 2023-09-27 ENCOUNTER — Ambulatory Visit (INDEPENDENT_AMBULATORY_CARE_PROVIDER_SITE_OTHER): Admitting: Otolaryngology

## 2023-09-27 ENCOUNTER — Encounter (INDEPENDENT_AMBULATORY_CARE_PROVIDER_SITE_OTHER): Payer: Self-pay | Admitting: Otolaryngology

## 2023-09-27 VITALS — BP 108/70 | HR 73

## 2023-09-27 DIAGNOSIS — R0981 Nasal congestion: Secondary | ICD-10-CM | POA: Diagnosis not present

## 2023-09-27 DIAGNOSIS — R519 Headache, unspecified: Secondary | ICD-10-CM

## 2023-09-27 DIAGNOSIS — J343 Hypertrophy of nasal turbinates: Secondary | ICD-10-CM

## 2023-09-27 DIAGNOSIS — H6990 Unspecified Eustachian tube disorder, unspecified ear: Secondary | ICD-10-CM

## 2023-09-27 DIAGNOSIS — J342 Deviated nasal septum: Secondary | ICD-10-CM

## 2023-09-27 DIAGNOSIS — Z9109 Other allergy status, other than to drugs and biological substances: Secondary | ICD-10-CM

## 2023-09-27 DIAGNOSIS — R0982 Postnasal drip: Secondary | ICD-10-CM

## 2023-09-27 DIAGNOSIS — J329 Chronic sinusitis, unspecified: Secondary | ICD-10-CM

## 2023-09-27 DIAGNOSIS — J3089 Other allergic rhinitis: Secondary | ICD-10-CM

## 2023-09-27 NOTE — Progress Notes (Signed)
 ENT Progress Note:   Update 09/27/2023  Discussed the use of AI scribe software for clinical note transcription with the patient, who gave verbal consent to proceed.  History of Present Illness Carol Garcia is a 23 year old female who presents with sinus congestion and headaches.  She experiences persistent sinus congestion with a sensation of fullness in her ears, described as feeling like 'cotton is in it'. This sensation has not been alleviated by previous treatments.  She has not undergone allergy testing due to changes in her insurance while switching jobs.  She has tried Xyzal  and Flonase  for about one to two weeks without significant improvement in her symptoms. She still has these medications and does not require refills at this time.  Her headaches originate from her nose and radiate above her eye to her temple. She reports a sensation of fullness in her ear, sometimes making it feel difficult to pop her ears.  Records Reviewed:  Initial Evaluation  Reason for Consult: chronic nasal congestion and post-nasal drainage    HPI: Discussed the use of AI scribe software for clinical note transcription with the patient, who gave verbal consent to proceed.  History of Present Illness Carol Garcia is a 23 year old female who presents with chronic nasal congestion and post-nasal drainage, worse at night.  She has a long-standing history of nasal congestion and drainage, described as watery and sometimes yellow. Her mother has noted the persistence of these symptoms, although she has only recently become aware of her chronic nature. No recent sinus infections, but a history of frequent sinus infections during childhood.  She experiences a sensation in her tooth when tilting her head back, which her dentist attributed to low sinuses based on dental scans. She recalls being taken to an ear, nose, and throat specialist in [place] during her childhood, where surgery was suggested,  but she did not pursue it at the time.  She has episodes of feeling like she is choking on drainage while sleeping, describing it as 'like somebody's pouring water', which causes her to wake up and the drainage to pour out. Her sense of smell is ok. No headaches.   She has not been tested for allergies recently and is not currently on any medications for nasal congestion or drainage.  History reviewed. No pertinent past medical history.  History reviewed. No pertinent surgical history.  Family History  Problem Relation Age of Onset   Cancer Other     Social History:  reports that she has never smoked. She has never been exposed to tobacco smoke. She has never used smokeless tobacco. She reports that she does not drink alcohol and does not use drugs.  Allergies: No Known Allergies  Medications: I have reviewed the patient's current medications.  The PMH, PSH, Medications, Allergies, and SH were reviewed and updated.  ROS: Constitutional: Negative for fever, weight loss and weight gain. Cardiovascular: Negative for chest pain and dyspnea on exertion. Respiratory: Is not experiencing shortness of breath at rest. Gastrointestinal: Negative for nausea and vomiting. Neurological: Negative for headaches. Psychiatric: The patient is not nervous/anxious  Blood pressure 108/70, pulse 73, SpO2 98%. There is no height or weight on file to calculate BMI.  PHYSICAL EXAM:  Exam: General: Well-developed, well-nourished Respiratory Respiratory effort: Equal inspiration and expiration without stridor Cardiovascular Peripheral Vascular: Warm extremities with equal color/perfusion Eyes: No nystagmus with equal extraocular motion bilaterally Neuro/Psych/Balance: Patient oriented to person, place, and time; Appropriate mood and affect; Gait is intact with no  imbalance; Cranial nerves I-XII are intact Head and Face Inspection: Normocephalic and atraumatic without mass or lesion Palpation:  Facial skeleton intact without bony stepoffs Salivary Glands: No mass or tenderness Facial Strength: Facial motility symmetric and full bilaterally ENT Pinna: External ear intact and fully developed External canal: Canal is patent with intact skin Tympanic Membrane: Clear and mobile External Nose: No scar or anatomic deformity Internal Nose: Septum is S-shaped and deviated with L > R narrowing. No polyp, or purulence. Mucosal edema and erythema present.  Bilateral inferior turbinate hypertrophy.  Lips, Teeth, and gums: Mucosa and teeth intact and viable TMJ: No pain to palpation with full mobility Oral cavity/oropharynx: No erythema or exudate, no lesions present Neck and Trachea: Midline trachea without mass or lesion Thyroid : No mass or nodularity Lymphatics: No lymphadenopathy   Studies Reviewed: 02/25/2018 FINDINGS: Small to moderate left pleural effusion and basilar airspace disease are seen. The right lung is clear. No pneumothorax. Heart size is normal.   IMPRESSION: Small to moderate left effusion and basilar airspace disease could be secondary to vaping injury or pneumonia.  Assessment/Plan: Encounter Diagnoses  Name Primary?   Chronic sinusitis, unspecified location Yes   Environmental allergies    Nasal septal deviation    Environmental and seasonal allergies    Chronic nasal congestion    Post-nasal drip    Hypertrophy of both inferior nasal turbinates     Assessment and Plan Assessment & Plan Chronic nasal congestion and nasal obstruction and suspected environmental allergies  Deviated nasal septum with ITH on nasal endoscopy today, small polypoid change in the left middle meatus, but no other polyps or pus.  - Initiated nightly antihistamine, Xyzal  5 mg daily  - Administered nasal corticosteroid spray (Flonase ) once or twice daily. - Advised nasal irrigation with saline using a neti pot or Neilmed bottle.  Chronic vs Recurrent sinusitis concerns  (suspected)  Differential includes sinus-related versus nasal obstruction and allergic inflammation. Allergy testing and sinus scan necessary for evaluation. - Order sinus CT sinus scan to evaluate for chronic sinusitis. - Recommend allergy testing to assess contributing factors.  RTC after CT sinuses and allergy testing (4 months)  Update 09/27/23  Chronic nasal congestion  Chronic nasal congestion with headaches. Allergy testing pending to identify allergens and assess need for immunotherapy. - Reorder CT scan of sinuses. - [facility] - Advised daily use of Xyzal  and Flonase .  Eustachian tube dysfunction Eustachian tube dysfunction with ear fullness due to pressure build-up. - Advise management of nasal congestion to improve eustachian tube function.  Thank you for allowing me to participate in the care of this patient. Please do not hesitate to contact me with any questions or concerns.   Elena Larry, MD Otolaryngology [facility] Phone: [phone] Fax: [phone]    09/27/2023, 2:18 PM

## 2023-09-27 NOTE — Patient Instructions (Signed)

## 2023-10-15 ENCOUNTER — Ambulatory Visit (INDEPENDENT_AMBULATORY_CARE_PROVIDER_SITE_OTHER): Admitting: Otolaryngology

## 2023-12-27 ENCOUNTER — Ambulatory Visit (INDEPENDENT_AMBULATORY_CARE_PROVIDER_SITE_OTHER): Admitting: Otolaryngology

## 2024-06-10 ENCOUNTER — Encounter (HOSPITAL_BASED_OUTPATIENT_CLINIC_OR_DEPARTMENT_OTHER): Admitting: Family Medicine
